# Patient Record
Sex: Male | Born: 1935 | Race: White | Hispanic: No | Marital: Married
Health system: Southern US, Community
[De-identification: ages and names within clinical notes are randomized; demographics above are authoritative.]

---

## 2004-06-02 ENCOUNTER — Ambulatory Visit: Payer: Self-pay | Admitting: Otolaryngology

## 2005-06-14 ENCOUNTER — Ambulatory Visit: Payer: Self-pay | Admitting: Psychiatry

## 2005-07-21 ENCOUNTER — Ambulatory Visit: Payer: Self-pay | Admitting: Psychiatry

## 2005-08-18 ENCOUNTER — Ambulatory Visit: Payer: Self-pay | Admitting: Psychiatry

## 2006-04-13 ENCOUNTER — Ambulatory Visit: Payer: Self-pay

## 2006-11-30 ENCOUNTER — Ambulatory Visit: Payer: Self-pay | Admitting: Gastroenterology

## 2006-12-05 ENCOUNTER — Ambulatory Visit: Payer: Self-pay | Admitting: Gastroenterology

## 2008-02-05 ENCOUNTER — Other Ambulatory Visit: Payer: Self-pay

## 2008-02-05 ENCOUNTER — Inpatient Hospital Stay: Payer: Self-pay | Admitting: Cardiology

## 2008-04-02 ENCOUNTER — Encounter: Payer: Self-pay | Admitting: Cardiology

## 2008-04-15 ENCOUNTER — Encounter: Payer: Self-pay | Admitting: Cardiology

## 2008-05-15 ENCOUNTER — Encounter: Payer: Self-pay | Admitting: Cardiology

## 2008-06-15 ENCOUNTER — Encounter: Payer: Self-pay | Admitting: Cardiology

## 2009-12-15 ENCOUNTER — Ambulatory Visit: Payer: Self-pay | Admitting: Unknown Physician Specialty

## 2012-05-29 ENCOUNTER — Ambulatory Visit: Payer: Self-pay | Admitting: Urology

## 2012-05-31 LAB — URINE CULTURE

## 2012-06-06 ENCOUNTER — Ambulatory Visit: Payer: Self-pay | Admitting: Urology

## 2012-06-26 ENCOUNTER — Ambulatory Visit: Payer: Self-pay | Admitting: Urology

## 2012-07-03 ENCOUNTER — Ambulatory Visit: Payer: Self-pay | Admitting: Urology

## 2012-08-02 ENCOUNTER — Ambulatory Visit: Payer: Self-pay | Admitting: Gastroenterology

## 2013-10-16 ENCOUNTER — Ambulatory Visit: Payer: Self-pay | Admitting: Oncology

## 2013-10-16 LAB — COMPREHENSIVE METABOLIC PANEL
ALBUMIN: 3.9 g/dL (ref 3.4–5.0)
ALK PHOS: 62 U/L
ALT: 45 U/L (ref 12–78)
ANION GAP: 1 — AB (ref 7–16)
BILIRUBIN TOTAL: 0.5 mg/dL (ref 0.2–1.0)
BUN: 21 mg/dL — AB (ref 7–18)
CO2: 30 mmol/L (ref 21–32)
Calcium, Total: 9.2 mg/dL (ref 8.5–10.1)
Chloride: 106 mmol/L (ref 98–107)
Creatinine: 1.44 mg/dL — ABNORMAL HIGH (ref 0.60–1.30)
EGFR (Non-African Amer.): 46 — ABNORMAL LOW
GFR CALC AF AMER: 53 — AB
GLUCOSE: 98 mg/dL (ref 65–99)
OSMOLALITY: 277 (ref 275–301)
POTASSIUM: 4 mmol/L (ref 3.5–5.1)
SGOT(AST): 31 U/L (ref 15–37)
SODIUM: 137 mmol/L (ref 136–145)
TOTAL PROTEIN: 7.2 g/dL (ref 6.4–8.2)

## 2013-10-16 LAB — CBC CANCER CENTER
BASOS ABS: 0.1 x10 3/mm (ref 0.0–0.1)
BASOS PCT: 1.1 %
Eosinophil #: 0.2 x10 3/mm (ref 0.0–0.7)
Eosinophil %: 1.6 %
HCT: 39.5 % — AB (ref 40.0–52.0)
HGB: 13.8 g/dL (ref 13.0–18.0)
LYMPHS ABS: 2.2 x10 3/mm (ref 1.0–3.6)
Lymphocyte %: 21 %
MCH: 33.2 pg (ref 26.0–34.0)
MCHC: 35 g/dL (ref 32.0–36.0)
MCV: 95 fL (ref 80–100)
Monocyte #: 1 x10 3/mm (ref 0.2–1.0)
Monocyte %: 9.1 %
Neutrophil #: 7.1 x10 3/mm — ABNORMAL HIGH (ref 1.4–6.5)
Neutrophil %: 67.2 %
PLATELETS: 124 x10 3/mm — AB (ref 150–440)
RBC: 4.17 10*6/uL — ABNORMAL LOW (ref 4.40–5.90)
RDW: 13.2 % (ref 11.5–14.5)
WBC: 10.6 x10 3/mm (ref 3.8–10.6)

## 2013-10-17 LAB — PSA: PSA: 34.4 ng/mL — ABNORMAL HIGH (ref 0.0–4.0)

## 2013-11-13 ENCOUNTER — Ambulatory Visit: Payer: Self-pay | Admitting: Oncology

## 2013-12-13 ENCOUNTER — Ambulatory Visit: Payer: Self-pay | Admitting: Oncology

## 2014-02-04 ENCOUNTER — Ambulatory Visit: Payer: Self-pay | Admitting: Otolaryngology

## 2014-03-04 ENCOUNTER — Ambulatory Visit: Payer: Self-pay | Admitting: Gastroenterology

## 2014-03-20 ENCOUNTER — Ambulatory Visit: Payer: Self-pay | Admitting: Gastroenterology

## 2014-12-02 NOTE — Op Note (Signed)
PATIENT NAME:  Benjamin LopesSTARLING, Bransyn H MR#:  [id number] DATE OF BIRTH:  Oct 19, 1931  DATE OF PROCEDURE:  06/06/2012  PREOPERATIVE DIAGNOSIS: Elevated prostate-specific antigen.   POSTOPERATIVE DIAGNOSIS: Elevated prostate-specific antigen.   PROCEDURES:  1. Saturation biopsies of the prostate.  2. Transrectal ultrasound of the prostate.  3. Ultrasound for needle guidance.   INDICATIONS: The patient is a 81 year old male with a long history of elevated PSA. He was initially biopsied in 1993 for a PSA of 9.9. The PSA has progressively risen, and he has had four previous biopsies, the last for a PSA of 17.31. 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 PSA has recently increased to 31.2. He presents for saturation biopsies.   DESCRIPTION OF PROCEDURE: The patient was taken to the Operating Room where general anesthetic was administered. He was placed in the low lithotomy position and his external genitalia were prepped and prepped in the usual fashion. Digital rectal exam was remarkable for an estimated prostatic volume of approximately 40 mL. No nodularity or induration noted. A transrectal ultrasound probe was lubricated and passed per rectum. There were no echogenic abnormalities of the peripheral zone noted. Prostate volume was calculated using the height/width/length determination with a volume of 40 cc. Under ultrasound guidance, saturation biopsies were then performed in a standard pleural biopsy pattern with two biopsies taken of each site for a total of 24 biopsies. There was no significant bleeding noted. The ultrasound probe was removed. The patient was in stable condition. There were no complications. EBL was minimal.  ____________________________ Verna CzechScott C. Lonna CobbStoioff, MD scs:cbb D: 06/06/2012 12:51:12 ET T: 06/06/2012 13:08:02 ET JOB#: [id number]  cc: Lorin PicketScott C. Lonna CobbStoioff, MD, <Dictator> Riki AltesSCOTT C STOIOFF MD ELECTRONICALLY SIGNED 06/07/2012 13:56

## 2015-12-23 IMAGING — RF DG BARIUM SWALLOW
2 series · 12 of 12 positions shown · non-contrast
Comparison: None.

CLINICAL DATA: Dysphagia and reflux ; history of previous
epiglottic surgery and hypopharyngeal polyp resection

EXAM:
ESOPHOGRAM / BARIUM SWALLOW / BARIUM TABLET STUDY
TECHNIQUE: Combined double contrast and single contrast examination performed
using effervescent crystals, thick barium liquid, and thin barium
liquid. The patient was observed with fluoroscopy swallowing a 13mm
barium sulphate tablet.
FLUOROSCOPY TIME:  1 min, 18 seconds.

[Series 1: fluoro_barium singleshot_bw · 0.17mm/px · 8 of 8 slices shown]
[im 1/8]
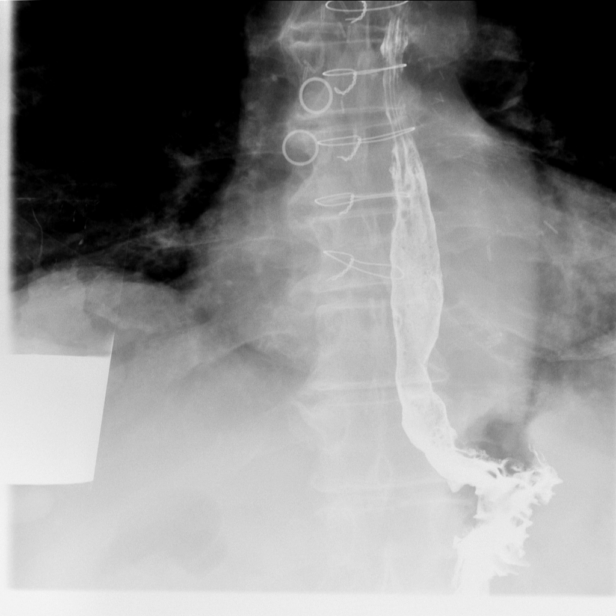
[im 2/8]
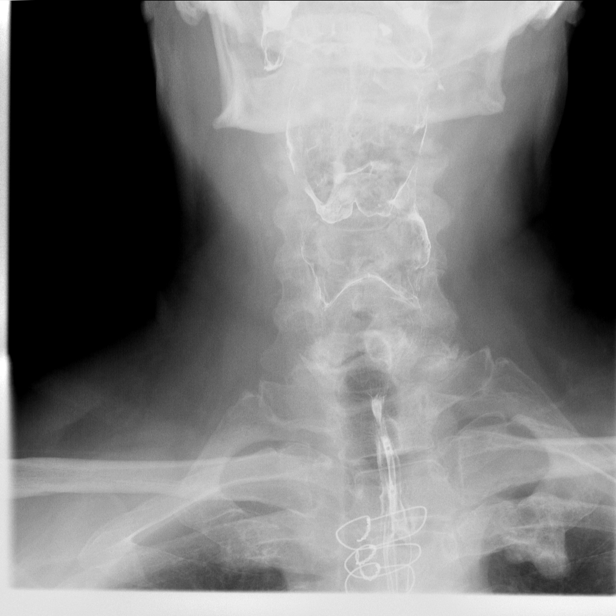
[im 3/8]
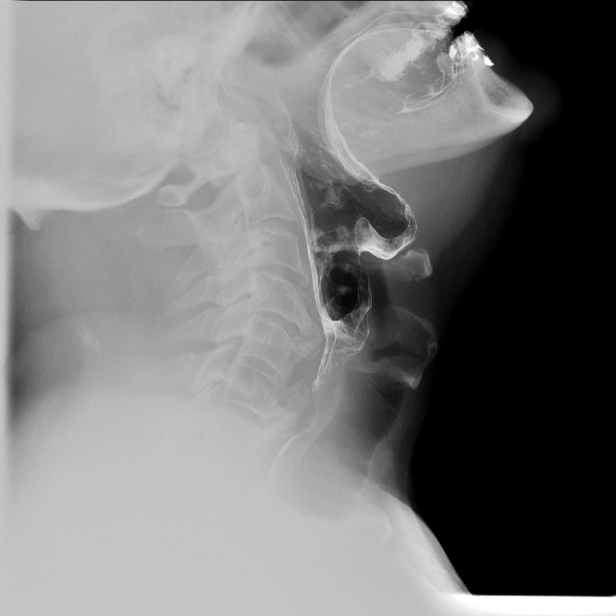
[im 4/8]
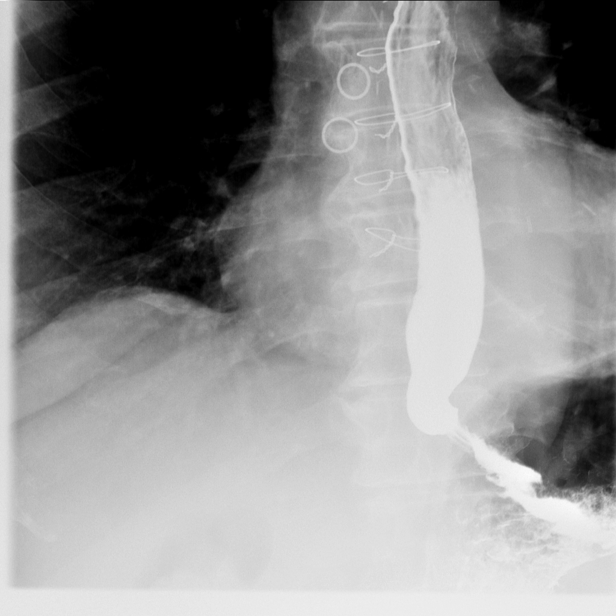
[im 5/8]
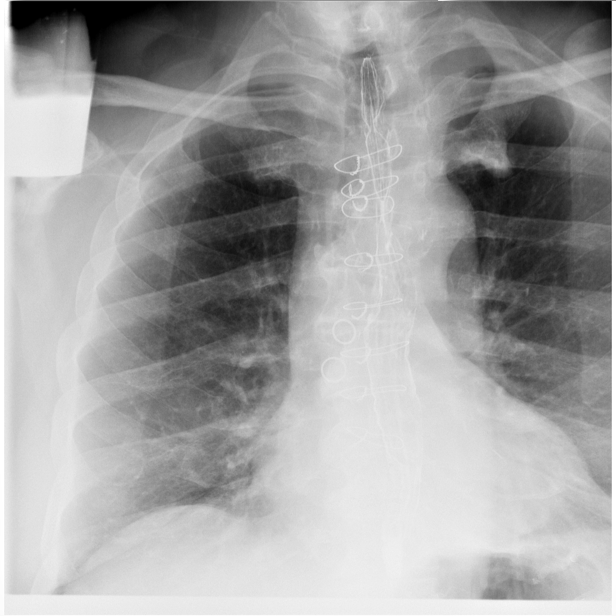
[im 6/8]
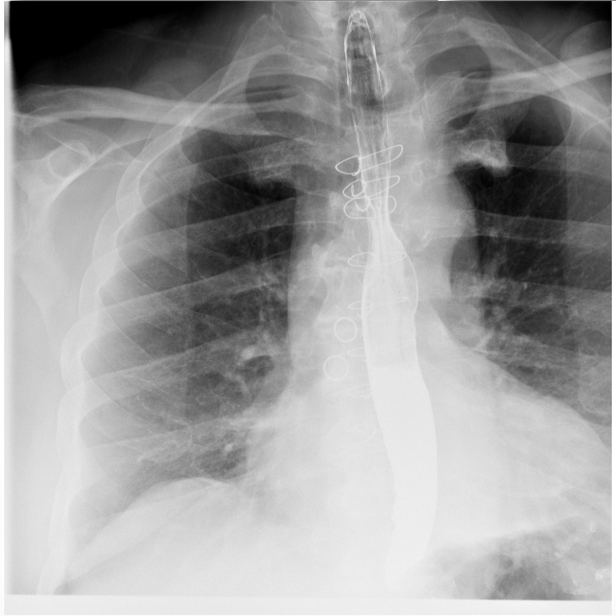
[im 7/8]
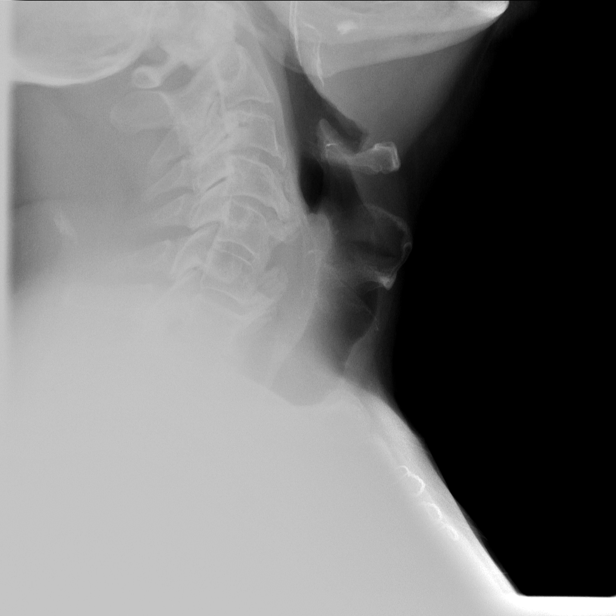
[im 8/8]
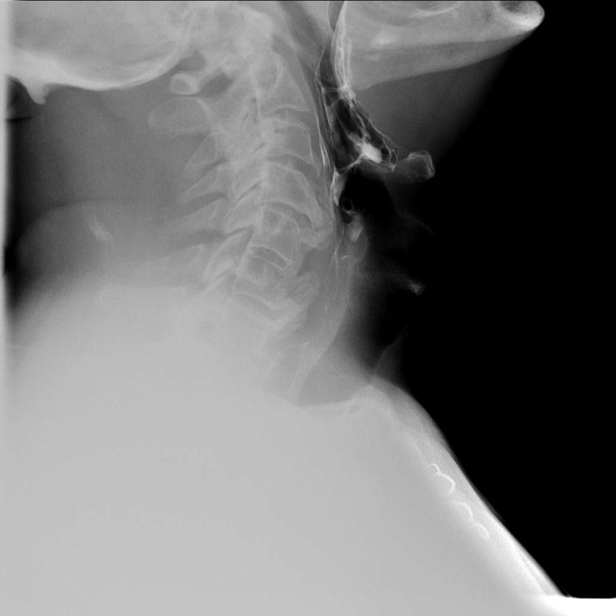

[Series 9: fluoro_barium 2fps_bw · 0.19mm/px · 4 of 17 frames shown]
[frame 3/17]
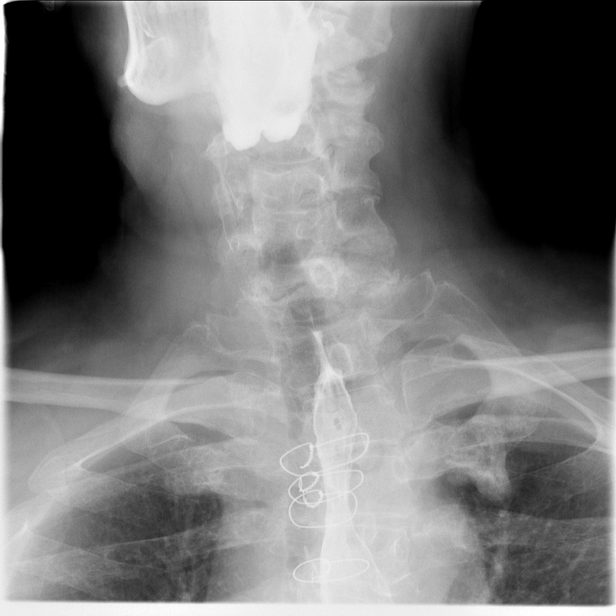
[frame 4/17]
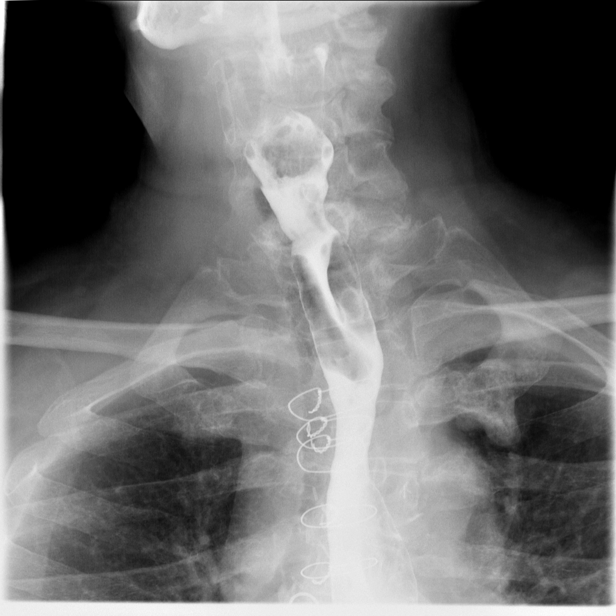
[frame 9/17]
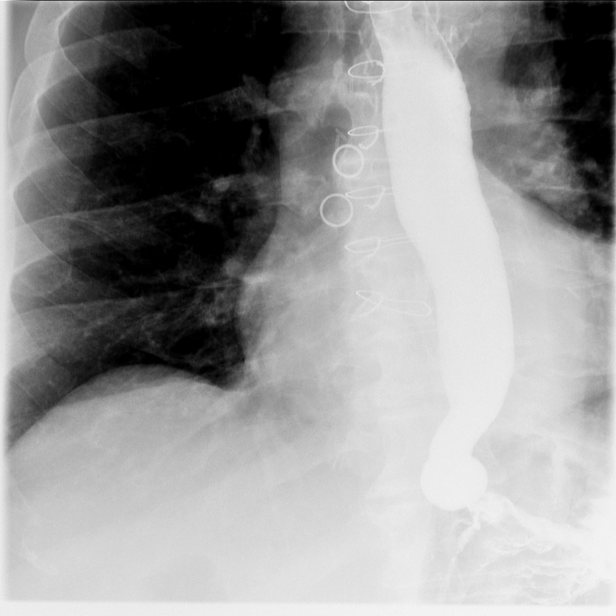
[frame 15/17]
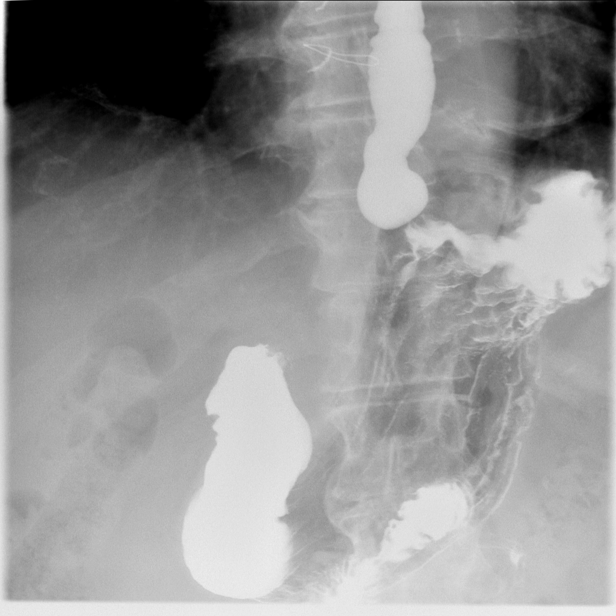

[12 of 12 positions shown; findings below may reference images not displayed]

FINDINGS: The patient ingested the thick and thin barium without difficulty.
There was no visible laryngeal penetration of the barium. The
patient did experience a tickle in his throat which elicited
coughing. The thoracic esophagus distended well. Very mild changes
of presbyesophagus were demonstrated. There was no visible reflux.
There was no evidence of esophagitis or stricture nor hiatal hernia.
The barium tablet passed without difficulty.

There are prominent anterior cervical endplate osteophytes from C3
through C7 which produce a posterior impression upon the esophagus.
IMPRESSION: 1. There are mild changes of presbyesophagus. There is no evidence
of esophagitis nor of stricture or significant reflux. The patient
had no difficulty swallowing the barium or tablet.
2. There was no laryngeal penetration of the barium though a cough
reflex was elicited due to a perceived "tickle in the throat". This
is a chronic problem.
3. Given the history of previous polyps, direct visualization of the
hypopharynx in addition to the planned upper endoscopy is
recommended.
4. Large anterior endplate osteophytes at multiple cervical levels
produce prominent impressions upon the posterior wall of the
cervical esophagus.
